# Patient Record
Sex: Male | Born: 1940 | Race: Black or African American | Hispanic: No | Marital: Single
Health system: Southern US, Community
[De-identification: ages and names within clinical notes are randomized; demographics above are authoritative.]

---

## 2004-12-17 ENCOUNTER — Emergency Department: Payer: Self-pay | Admitting: Internal Medicine

## 2005-05-19 ENCOUNTER — Emergency Department: Payer: Self-pay | Admitting: Emergency Medicine

## 2006-10-05 ENCOUNTER — Emergency Department: Payer: Self-pay | Admitting: Emergency Medicine

## 2006-10-05 ENCOUNTER — Other Ambulatory Visit: Payer: Self-pay

## 2006-12-03 ENCOUNTER — Ambulatory Visit: Payer: Self-pay | Admitting: Urology

## 2006-12-08 ENCOUNTER — Ambulatory Visit: Payer: Self-pay | Admitting: Urology

## 2006-12-09 ENCOUNTER — Ambulatory Visit: Payer: Self-pay | Admitting: Radiation Oncology

## 2006-12-24 ENCOUNTER — Ambulatory Visit: Payer: Self-pay | Admitting: Urology

## 2007-01-09 ENCOUNTER — Ambulatory Visit: Payer: Self-pay | Admitting: Radiation Oncology

## 2007-01-11 ENCOUNTER — Ambulatory Visit: Payer: Self-pay | Admitting: Radiation Oncology

## 2007-02-08 ENCOUNTER — Ambulatory Visit: Payer: Self-pay | Admitting: Radiation Oncology

## 2007-03-11 ENCOUNTER — Ambulatory Visit: Payer: Self-pay | Admitting: Radiation Oncology

## 2007-04-11 ENCOUNTER — Ambulatory Visit: Payer: Self-pay | Admitting: Radiation Oncology

## 2007-05-09 ENCOUNTER — Ambulatory Visit: Payer: Self-pay | Admitting: Radiation Oncology

## 2007-06-09 ENCOUNTER — Ambulatory Visit: Payer: Self-pay | Admitting: Radiation Oncology

## 2007-08-09 ENCOUNTER — Ambulatory Visit: Payer: Self-pay | Admitting: Radiation Oncology
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
## 2010-06-21 ENCOUNTER — Ambulatory Visit: Payer: Self-pay | Admitting: Ophthalmology

## 2010-06-26 ENCOUNTER — Ambulatory Visit: Payer: Self-pay | Admitting: Ophthalmology

## 2011-04-02 ENCOUNTER — Inpatient Hospital Stay: Payer: Self-pay | Admitting: Internal Medicine

## 2011-04-02 LAB — COMPREHENSIVE METABOLIC PANEL
Albumin: 3.3 g/dL — ABNORMAL LOW (ref 3.4–5.0)
Alkaline Phosphatase: 74 U/L (ref 50–136)
Anion Gap: 8 (ref 7–16)
BUN: 13 mg/dL (ref 7–18)
Calcium, Total: 8.9 mg/dL (ref 8.5–10.1)
Co2: 28 mmol/L (ref 21–32)
Creatinine: 0.89 mg/dL (ref 0.60–1.30)
Glucose: 98 mg/dL (ref 65–99)
Osmolality: 279 (ref 275–301)
SGPT (ALT): 11 U/L — ABNORMAL LOW
Sodium: 140 mmol/L (ref 136–145)
Total Protein: 7.3 g/dL (ref 6.4–8.2)

## 2011-04-02 LAB — URINALYSIS, COMPLETE
Bilirubin,UR: NEGATIVE
Blood: NEGATIVE
Glucose,UR: NEGATIVE mg/dL (ref 0–75)
Nitrite: NEGATIVE
Ph: 7 (ref 4.5–8.0)
Protein: NEGATIVE
RBC,UR: 2 /HPF (ref 0–5)
Squamous Epithelial: 1
WBC UR: 42 /HPF (ref 0–5)

## 2011-04-02 LAB — CBC
HCT: 43.8 % (ref 40.0–52.0)
HGB: 14.4 g/dL (ref 13.0–18.0)
MCH: 29 pg (ref 26.0–34.0)
MCHC: 32.8 g/dL (ref 32.0–36.0)
MCV: 88 fL (ref 80–100)
RBC: 4.95 10*6/uL (ref 4.40–5.90)
RDW: 14.8 % — ABNORMAL HIGH (ref 11.5–14.5)

## 2011-04-02 LAB — TROPONIN I: Troponin-I: <0.02 ng/mL

## 2011-04-03 LAB — COMPREHENSIVE METABOLIC PANEL
Albumin: 3.1 g/dL — ABNORMAL LOW (ref 3.4–5.0)
Alkaline Phosphatase: 63 U/L (ref 50–136)
Bilirubin,Total: 0.3 mg/dL (ref 0.2–1.0)
Co2: 24 mmol/L (ref 21–32)
Creatinine: 0.72 mg/dL (ref 0.60–1.30)
EGFR (Non-African Amer.): >60
Glucose: 99 mg/dL (ref 65–99)
Osmolality: 282 (ref 275–301)
SGOT(AST): 15 U/L (ref 15–37)
SGPT (ALT): 10 U/L — ABNORMAL LOW
Sodium: 141 mmol/L (ref 136–145)
Total Protein: 7 g/dL (ref 6.4–8.2)

## 2011-04-03 LAB — CBC WITH DIFFERENTIAL/PLATELET
Basophil %: 1 %
Eosinophil #: 0 10*3/uL (ref 0.0–0.7)
Eosinophil %: 0.7 %
HGB: 14.6 g/dL (ref 13.0–18.0)
MCH: 28.8 pg (ref 26.0–34.0)
MCV: 88 fL (ref 80–100)
Monocyte #: 0.4 10*3/uL (ref 0.0–0.7)
Neutrophil #: 3.2 10*3/uL (ref 1.4–6.5)
RBC: 5.09 10*6/uL (ref 4.40–5.90)
WBC: 5.1 10*3/uL (ref 3.8–10.6)

## 2011-04-03 LAB — LIPID PANEL
HDL Cholesterol: 54 mg/dL (ref 40–60)
Ldl Cholesterol, Calc: 102 mg/dL — ABNORMAL HIGH (ref 0–100)
Triglycerides: 79 mg/dL (ref 0–200)
VLDL Cholesterol, Calc: 16 mg/dL (ref 5–40)

## 2013-11-08 ENCOUNTER — Ambulatory Visit: Payer: Self-pay | Admitting: Internal Medicine

## 2013-12-04 LAB — URINALYSIS, COMPLETE
Bilirubin,UR: NEGATIVE
GLUCOSE, UR: NEGATIVE mg/dL (ref 0–75)
KETONE: NEGATIVE
Nitrite: NEGATIVE
PH: 5 (ref 4.5–8.0)
Protein: 100
SPECIFIC GRAVITY: 1.018 (ref 1.003–1.030)
Squamous Epithelial: 2

## 2013-12-04 LAB — CBC
HCT: 33.5 % — AB (ref 40.0–52.0)
HGB: 10.4 g/dL — ABNORMAL LOW (ref 13.0–18.0)
MCH: 26 pg (ref 26.0–34.0)
MCHC: 31.2 g/dL — ABNORMAL LOW (ref 32.0–36.0)
MCV: 84 fL (ref 80–100)
PLATELETS: 485 10*3/uL — AB (ref 150–440)
RBC: 4.02 10*6/uL — ABNORMAL LOW (ref 4.40–5.90)
RDW: 15.3 % — ABNORMAL HIGH (ref 11.5–14.5)
WBC: 10.6 10*3/uL (ref 3.8–10.6)

## 2013-12-04 LAB — COMPREHENSIVE METABOLIC PANEL
ANION GAP: 4 — AB (ref 7–16)
Albumin: 2.1 g/dL — ABNORMAL LOW (ref 3.4–5.0)
Alkaline Phosphatase: 106 U/L
BUN: 16 mg/dL (ref 7–18)
Bilirubin,Total: 0.2 mg/dL (ref 0.2–1.0)
CALCIUM: 7.7 mg/dL — AB (ref 8.5–10.1)
CO2: 30 mmol/L (ref 21–32)
CREATININE: 0.88 mg/dL (ref 0.60–1.30)
Chloride: 107 mmol/L (ref 98–107)
EGFR (African American): >60
GLUCOSE: 92 mg/dL (ref 65–99)
OSMOLALITY: 282 (ref 275–301)
Potassium: 4 mmol/L (ref 3.5–5.1)
SGOT(AST): 13 U/L — ABNORMAL LOW (ref 15–37)
SGPT (ALT): 10 U/L — ABNORMAL LOW
SODIUM: 141 mmol/L (ref 136–145)
Total Protein: 6.8 g/dL (ref 6.4–8.2)

## 2013-12-04 LAB — TROPONIN I

## 2013-12-04 LAB — LIPASE, BLOOD: Lipase: 44 U/L — ABNORMAL LOW (ref 73–393)

## 2013-12-05 ENCOUNTER — Inpatient Hospital Stay: Payer: Self-pay | Admitting: Internal Medicine

## 2013-12-05 LAB — CLOSTRIDIUM DIFFICILE(ARMC)

## 2013-12-06 LAB — URINALYSIS, COMPLETE
BACTERIA: NONE SEEN
Bilirubin,UR: NEGATIVE
GLUCOSE, UR: NEGATIVE mg/dL (ref 0–75)
KETONE: NEGATIVE
NITRITE: NEGATIVE
Ph: 6 (ref 4.5–8.0)
RBC,UR: 2873 /HPF (ref 0–5)
SQUAMOUS EPITHELIAL: NONE SEEN
Specific Gravity: 1.009 (ref 1.003–1.030)

## 2013-12-06 LAB — PROTIME-INR
INR: 1.1
Prothrombin Time: 13.6 secs (ref 11.5–14.7)

## 2013-12-06 LAB — APTT: Activated PTT: 31.5 secs (ref 23.6–35.9)

## 2013-12-07 LAB — CBC WITH DIFFERENTIAL/PLATELET
BASOS ABS: 0.1 10*3/uL (ref 0.0–0.1)
Basophil %: 1 %
Eosinophil #: 0.7 10*3/uL (ref 0.0–0.7)
Eosinophil %: 5.9 %
HCT: 30 % — ABNORMAL LOW (ref 40.0–52.0)
HGB: 9.5 g/dL — AB (ref 13.0–18.0)
LYMPHS ABS: 1.6 10*3/uL (ref 1.0–3.6)
Lymphocyte %: 13.3 %
MCH: 25.8 pg — ABNORMAL LOW (ref 26.0–34.0)
MCHC: 31.5 g/dL — AB (ref 32.0–36.0)
MCV: 82 fL (ref 80–100)
MONO ABS: 0.8 x10 3/mm (ref 0.2–1.0)
MONOS PCT: 6.7 %
NEUTROS PCT: 73.1 %
Neutrophil #: 9 10*3/uL — ABNORMAL HIGH (ref 1.4–6.5)
Platelet: 412 10*3/uL (ref 150–440)
RBC: 3.66 10*6/uL — AB (ref 4.40–5.90)
RDW: 15.2 % — ABNORMAL HIGH (ref 11.5–14.5)
WBC: 12.3 10*3/uL — ABNORMAL HIGH (ref 3.8–10.6)

## 2013-12-08 ENCOUNTER — Ambulatory Visit: Payer: Self-pay | Admitting: Urology

## 2013-12-08 ENCOUNTER — Ambulatory Visit: Payer: Self-pay | Admitting: Internal Medicine

## 2013-12-09 LAB — CBC WITH DIFFERENTIAL/PLATELET
BASOS PCT: 0.8 %
Basophil #: 0.1 10*3/uL (ref 0.0–0.1)
EOS PCT: 10.7 %
Eosinophil #: 1.4 10*3/uL — ABNORMAL HIGH (ref 0.0–0.7)
HCT: 26.2 % — ABNORMAL LOW (ref 40.0–52.0)
HGB: 7.9 g/dL — ABNORMAL LOW (ref 13.0–18.0)
LYMPHS ABS: 1.9 10*3/uL (ref 1.0–3.6)
Lymphocyte %: 14.9 %
MCH: 25 pg — ABNORMAL LOW (ref 26.0–34.0)
MCHC: 30.2 g/dL — ABNORMAL LOW (ref 32.0–36.0)
MCV: 83 fL (ref 80–100)
MONOS PCT: 6.5 %
Monocyte #: 0.8 x10 3/mm (ref 0.2–1.0)
NEUTROS ABS: 8.7 10*3/uL — AB (ref 1.4–6.5)
NEUTROS PCT: 67.1 %
PLATELETS: 373 10*3/uL (ref 150–440)
RBC: 3.17 10*6/uL — ABNORMAL LOW (ref 4.40–5.90)
RDW: 15.1 % — AB (ref 11.5–14.5)
WBC: 12.9 10*3/uL — AB (ref 3.8–10.6)

## 2013-12-09 LAB — BASIC METABOLIC PANEL
ANION GAP: 2 — AB (ref 7–16)
BUN: 9 mg/dL (ref 7–18)
CREATININE: 0.85 mg/dL (ref 0.60–1.30)
Calcium, Total: 7.2 mg/dL — ABNORMAL LOW (ref 8.5–10.1)
Chloride: 109 mmol/L — ABNORMAL HIGH (ref 98–107)
Co2: 27 mmol/L (ref 21–32)
EGFR (African American): >60
Glucose: 97 mg/dL (ref 65–99)
Osmolality: 274 (ref 275–301)
Potassium: 3.9 mmol/L (ref 3.5–5.1)
SODIUM: 138 mmol/L (ref 136–145)

## 2013-12-10 LAB — CBC WITH DIFFERENTIAL/PLATELET
BASOS ABS: 0.2 10*3/uL — AB (ref 0.0–0.1)
BASOS PCT: 1.3 %
Eosinophil #: 1.5 10*3/uL — ABNORMAL HIGH (ref 0.0–0.7)
Eosinophil %: 12.5 %
HCT: 27.4 % — ABNORMAL LOW (ref 40.0–52.0)
HGB: 8.3 g/dL — AB (ref 13.0–18.0)
Lymphocyte #: 1.4 10*3/uL (ref 1.0–3.6)
Lymphocyte %: 11.3 %
MCH: 24.9 pg — AB (ref 26.0–34.0)
MCHC: 30.5 g/dL — ABNORMAL LOW (ref 32.0–36.0)
MCV: 82 fL (ref 80–100)
Monocyte #: 0.8 x10 3/mm (ref 0.2–1.0)
Monocyte %: 6.3 %
Neutrophil #: 8.5 10*3/uL — ABNORMAL HIGH (ref 1.4–6.5)
Neutrophil %: 68.6 %
Platelet: 409 10*3/uL (ref 150–440)
RBC: 3.35 10*6/uL — AB (ref 4.40–5.90)
RDW: 15.2 % — AB (ref 11.5–14.5)
WBC: 12.4 10*3/uL — ABNORMAL HIGH (ref 3.8–10.6)

## 2013-12-10 LAB — CULTURE, BLOOD (SINGLE)

## 2013-12-10 LAB — BASIC METABOLIC PANEL
Anion Gap: 4 — ABNORMAL LOW (ref 7–16)
BUN: 10 mg/dL (ref 7–18)
CALCIUM: 7.1 mg/dL — AB (ref 8.5–10.1)
CO2: 26 mmol/L (ref 21–32)
CREATININE: 0.78 mg/dL (ref 0.60–1.30)
Chloride: 107 mmol/L (ref 98–107)
Glucose: 95 mg/dL (ref 65–99)
Osmolality: 273 (ref 275–301)
POTASSIUM: 4.2 mmol/L (ref 3.5–5.1)
Sodium: 137 mmol/L (ref 136–145)

## 2013-12-12 LAB — PSA: PSA: 2.3 ng/mL (ref 0.0–4.0)

## 2013-12-12 LAB — HEMOGLOBIN: HGB: 9.1 g/dL — ABNORMAL LOW (ref 13.0–18.0)

## 2013-12-15 ENCOUNTER — Ambulatory Visit: Payer: Self-pay | Admitting: Oncology

## 2013-12-16 LAB — PATHOLOGY REPORT

## 2014-01-08 ENCOUNTER — Ambulatory Visit: Payer: Self-pay | Admitting: Oncology

## 2014-01-08 ENCOUNTER — Ambulatory Visit: Payer: Self-pay | Admitting: Internal Medicine

## 2014-03-10 DEATH — deceased

## 2014-07-01 NOTE — Consult Note (Signed)
Note Type Consult   HPI: This 74 year old Male patient presents to the clinic for initial evaluation of  lung mass.  Subjective: Chief Complaint/Diagnosis:   Lung mass, highly suspicious for underlying malignancy. HPI:   Patient is a 74 year old male who was recently admitted to the hospital with persistent diarrhea for approximately one week. He also complains of increased weakness and fatigue. He also reported a nearly 40 pound weight loss over the past year. He states he has a good appetite. He has no neurologic complaints. He denies any fevers. He denies any chest pain, shortness of breath, hemoptysis, or cough. He denies any nausea or vomiting. Patient otherwise feels well and offers no further specific complaints.   Review of Systems:  Performance Status (ECOG): 1  Pain ?: No complaints (0, none)  Emotional well-being: None  Review of Systems:   As per HPI. Otherwise, 10 point system review was negative.   Allergies:  ASA: Other  PFSH: Additional Past Medical and Surgical History: peptic ulcer disease, SPO, hypertension, partial gastrectomy, partial small bowel resection.    Family history: "Cancer" in brothers and sisters.    Social history: Positive tobacco, unclear how much. Patient denies alcohol.   Vital Signs:  :: vital signs stable, patient afebrile.   Physical Exam:  General: cachectic-looking, no acute distress  Mental Status: normal affect  Eyes: anicteric sclera  Head, Ears, Nose,Throat: Normocephalic, moist mucous membranes, clear oropharynx without erythema or thrush.  Neck, Thyroid: No palpable lymphadenopathy, thyroid midline without nodules.  Respiratory: clear to auscultation bilaterally  Cardiovascular: regular rate and rhythm, no murmur, rub, or gallop  Gastrointestinal: soft, nondistended, nontender, no organomegaly.  normal active bowel sounds  Musculoskeletal: No edema  Skin: No rash or petechiae noted  Neurological: alert, answering all  questions appropriately.  Cranial nerves grossly intact   Lab Results Review:  Lab Results   Comprehensive Metabolic Panel  :  [id number] 22:50:  Last Resulted Date/Time.141 K+: 4.0 CO2: 30 Chl: 107 BUN: 16 Cre: 0.88 Glu:92 Calcium: 7.7(L) Alk Phos: 106 ALT: 10(L) AST: 13(L) T.Prot: 6.8 Alb: 2.1(L) eGFR-AA: >60 eGFR: >60 .   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  Diffuse emphysematous changes in the lungs. No other lung nodules  identified. No focal acute process demonstrated in the abdomen or  pelvis. Focal central dilated bowel structure may represent a large  diverticulum. Anterior compression fracture of T12 vertebra.  Multiple old rib fractures.      Electronically Signed    By: Lucienne Capers M.D.    On: 12/05/2013 02:19         Verified By: Neale Burly, M.D.,  Assessment and Plan: Impression:   Lung mass highly suspicious for underlying malignancy. Plan:   1. Lung mass: Highly suspicious for underlying malignancy and patient will require a biopsy to confirm. Pulmonary has been consult. Have ordered a PET scan for Tuesday morning for further evaluation and staging purposes. If patient is discharged, much of his workup can be completed as an outpatient.Diarrhea: Unlikely related to his underlying malignancy. consult, will follow.  Electronic Signatures: Delight Hoh (MD)  (Signed 28-Sep-15 17:44)  Authored: Note Type, History of Present Illness, CC/HPI, Review of Systems, ALLERGIES, Patient Family Social History, Vital Signs, Physical Exam, Lab Results Review, Rad Results  Review, Assessment and Plan   Last Updated: 28-Sep-15 17:44 by Delight Hoh (MD)

## 2014-07-01 NOTE — Consult Note (Signed)
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 Further testing is currently pending. PET scan results also noted. Will await final pathology prior to commenting further. By report, [place]. If patient remains in the hospital over the weekend, will order MRI of the brain for Monday to complete the staging workup.  follow.    Electronic Signatures: Gerarda FractionFinnegan, Timothy (MD)  (Signed on 02-Oct-15 11:51)  Authored  Last Updated: 02-Oct-15 11:51 by Gerarda FractionFinnegan, Timothy (MD)

## 2014-07-01 NOTE — Discharge Summary (Signed)
PATIENT NAME:  Kent Dennis, Kent Dennis DATE OF BIRTH:  1940-12-17  DATE OF ADMISSION:  12/05/2013 DATE OF DISCHARGE:  12/12/2013  PRIMARY CARE PHYSICIAN: Nonlocal.  ONCOLOGIST: Tollie Pizzaimothy J. Orlie DakinFinnegan, MD   FINAL DIAGNOSES:  1.  Large lung mass with hilar metastases; pathology still pending.  2.  Hematuria, benign prostatic hypertrophy, urinary retention, possible urinary tract infection, history of prostate cancer.  3.  Malnutrition.  4.  Hypertension.  5.  Anemia.  6.  Constipation.   MEDICATIONS ON DISCHARGE INCLUDE: Oxycodone 5 mg every 6 hours as needed for severe pain, Flomax 0.4 mg daily, MiraLax 17 g once a day, Ensure Plus 237 mL 3 times a day, Cipro 500 mg every 12 hours for 5 days then stop, ferrous sulfate 325 mg 1 tablet twice a day.   DISPOSITION: The patient was admitted on 12/05/2013 and was discharged to rehab on 12/12/2013.   LABORATORY AND RADIOLOGICAL DATA DURING THE HOSPITAL COURSE INCLUDED: A chest x-ray that showed a left posterior basilar opacity concerning for pneumonia associated pleural effusion, and a large rounded mass noted in the posterior portion of the left upper lobe; CT scan was recommended.   Abdominal x-ray negative. Urinalysis with 3+ blood, nitrites negative, leukocyte esterase trace. Troponin negative. Glucose 92, BUN 16, creatinine 0.88, sodium 141, potassium 4.0, chloride 107, CO2 of 30, calcium 7.7. Liver function tests in normal range. Albumin 2.1. White blood cell count 10.6, hemoglobin and hematocrit 10.4 and 33.5, platelet count 485,000. EKG showed normal sinus rhythm and left axis deviation. Blood cultures were negative.   CT scan of the chest, abdomen and pelvis with contrast showed a large mass in the left apex with enlarged left hilar lymph nodes, likely representing primary lung cancer with hilar metastases. Diffuse emphysematous changes. No other nodules identified. Anterior compression fracture at T12 vertebral body. Multiple old rib  fractures.   Stool for Clostridium difficile was negative.   PET CT scan showed hypermetabolic left apical Pancoast tumor, most consistent with primary bronchogenic carcinoma. No evidence of hypermetabolic nodal metastases. Hypermetabolism in the gluteal cleft. Bladder distention suggesting component of bladder outlet obstruction. Advanced atherosclerosis.   Repeat urinalysis with 3+ leukocyte esterase, 3+ blood. Biopsy done on 11/22/2013. PSA 2.3.   Hemoglobin dipped down to 7.9 on 12/09/2013. Hemoglobin 9.1 on 12/12/2013.   MRI of the brain, with and without contrast, showed no evidence of intracranial metastatic disease. No definite region of osseous metastases. Paranasal sinus disease. Generalized atrophy, Remote areas of cerebral infarction. Extensive chronic microvascular ischemic change.  CONSULTANTS DURING THE HOSPITAL COURSE INCLUDED: Dr. Tollie Pizzaimothy J. Orlie DakinFinnegan, oncology. Dr. Harriett SineNancy Phifer, palliative care. Dr. Angelina Pihajesh R. [name], urology.   HOSPITAL COURSE PER PROBLEM LIST:  1.  For the patient's large lung mass with hilar metastases, Dr. Orlie DakinFinnegan will follow up closely as an outpatient. The patient had a biopsy while here; we are still waiting for the final pathology results. Dr. Orlie DakinFinnegan will follow up the patient closely from the rehab and set up whatever needs to be done. The patient is a Do Not Resuscitate; overall prognosis is poor.  2.  Hematuria, benign prostatic hypertrophy,  urinary retention,  possible urinary tract infection, history of prostate cancer: The patient has a Foley in. Urology recommended keeping the Foley in for 1 week and then trying to take it out. The patient can follow up with Dr. Vanna ScotlandAshley Brandon as an outpatient for a cystoscopy for further workup if clinically warranted. Will finish up a course of  Cipro for possible UTI. Unfortunately, no urine culture was sent during the hospital course, Flomax given for BPH. PSA is normal range.  3.  Malnutrition. The patient is  on Ensure.  4.  Hypertension. I will hold off on blood pressure medication since the patient is likely to under treatment.  5.  Anemia, likely of chronic disease. Will start iron supplementation. Can consider Procrit as an outpatient.  6.  Constipation. Will continue MiraLax on a daily basis.   TIME SPENT ON DISCHARGE: 40 minutes.   OVERALL PROGNOSIS: Poor. I did explain to the patient's son, Trachea is midline, that it would probably be best that the patient goes out to rehab and get his treatments here rather than going to [name]. The patient does not want to go to [name]. We will send out to rehab.   Case discussed with Dr. Orlie Dakin prior to discharge.    ____________________________ Herschell Dimes. Renae Gloss, MD rjw:MT D: 12/12/2013 08:56:27 ET T: 12/12/2013 09:08:13 ET JOB#: [id number]  cc: Herschell Dimes. Renae Gloss, MD, <Dictator> Tollie Pizza. Orlie Dakin, MD Salley Scarlet MD ELECTRONICALLY SIGNED 12/25/2013 12:47

## 2014-07-01 NOTE — H&P (Signed)
PATIENT NAME:  Kent Dennis, Kent Dennis MR#:  [id number] DATE OF BIRTH:  [birth year]  DATE OF ADMISSION:  12/05/2013   REFERRING PHYSICIAN: Rebecka Apley, MD  PRIMARY CARE DOCTOR: Non-local   ADMISSION DIAGNOSIS:  Lung mass.  HISTORY OF PRESENT ILLNESS: This 74 year old African American male who presents to the Emergency Department complaining of diarrhea. The patient states that he has been having loose stools for approximately 1 week. He denies fevers, nausea or vomiting or abdominal pain. He admits that he has had a great appetite, but that he feels generally weak. He is also concerned that his niece, with whom he stays, cannot take care of him. Following routine evaluation of his complaints, a suspicious mass was seen on chest x-ray and characterized better as a solid mass on CT scan. The patient denies any pain. This prompted the Emergency Department to call for admission.   REVIEW OF SYSTEMS:  CONSTITUTIONAL: The patient denies fever, but admits to weakness.  EYES: Denies diplopia or inflammation.  EARS, NOSE AND THROAT: Denies tinnitus or difficulty swallowing.  RESPIRATORY: Admits to an occasional cough, but denies shortness of breath.  CARDIOVASCULAR: Denies chest pain or palpitations.  GASTROINTESTINAL: Denies nausea or vomiting, but admits diarrhea.  GENITOURINARY: The patient denies dysuria, but admits to increased frequency. He denies hesitancy.  ENDOCRINE: The patient denies polydipsia or polyuria.  HEMATOLOGIC AND LYMPHATIC: The patient denies easy bruising or bleeding.  MUSCULOSKELETAL: The patient denies myalgias or arthralgias, although he admits that his whole left side aches sometimes since he has had a stroke.  SKIN: The patient denies rashes or lesions.  NEUROLOGIC: Denies numbness in his extremities or dysarthria.  PSYCHIATRIC: Denies depression or suicidal ideation.   PAST MEDICAL HISTORY: Peptic ulcer disease, small bowel obstruction, hypertension.   PAST SURGICAL  HISTORY: Partial gastrectomy and small bowel resection.   FAMILY HISTORY: Cancers in his brothers and sisters who are deceased of those conditions. He is not clear on what types of cancers they had.   SOCIAL HISTORY: The patient does not drink or do any drugs. He says he will occasionally smoke a cigarette.   PERTINENT LABORATORY RESULTS AND RADIOGRAPHIC FINDINGS: BMP is normal. Calcium 7.7, albumin  2.1, alkaline phosphatase 106. AST 13, ALT 10. Troponin is negative. White blood cell count is 10.6, hemoglobin is 10.4, hematocrit is 33.5. Urinalysis shows 1668 white blood cells per high-power field, nitrite negative, 100 mg/dL of protein; there is  2+ bacteria seen in the urine.   A chest x-ray shows a left posterior basilar opacity concerning for pneumonia or atelectasis with associated pleural effusion. There is also a large round mass noted in the posterior portion of the left upper lobe.   A 3-view abdominal x-ray shows new development of a large posterior lobular mass in the left apex. There are some thickening of the pleura in the left costophrenic angle or fluid with linear fibrosis or atelectasis in the left base. There is a nonobstructive bowel gas pattern.   PHYSICAL EXAMINATION:  VITAL SIGNS: Temperature is 98.4, pulse 81, respirations 18, blood pressure 135/93, pulse oximetry is 98% on room air.  GENERAL: The patient is alert and oriented x 3, in no apparent distress.  HEENT: Normocephalic, atraumatic. Pupils equal, round, and reactive to light and accommodation. Extraocular movements are intact. Mucous membranes are moist.  NECK: Trachea is midline. No adenopathy.  CHEST: Symmetric and atraumatic.  CARDIOVASCULAR: Regular rate and rhythm. Normal S1, S2. No rubs, clicks, or murmurs appreciated.  LUNGS: Clear  to auscultation. Normal effort and excursion.  ABDOMEN: Positive bowel sounds. Soft, nontender, nondistended. No hepatosplenomegaly.  GENITOURINARY: Deferred.  MUSCULOSKELETAL:  The patient moves all 4 extremities equally. The left leg is subjectively weaker than the right.  SKIN: No rashes or lesions.  EXTREMITIES: No clubbing, cyanosis, or edema.  NEUROLOGIC: Cranial nerves II through XII are grossly intact.  PSYCHIATRIC: Mood is normal. Affect is congruent.   ASSESSMENT AND PLAN: This is a 74 year old male with a new lung mass as well as cachexia, urinary tract infection and possible pneumonia.  1.  Lung mass. The patient's original complaint was nothing to do with his respiratory status, but the mass was discovered incidentally. His sodium is normal and his corrected calcium is normal. I have ordered a hematology and oncology consult to establish a game plan for diagnosis and treatment, which will likely take place as an outpatient.  2.  Malnutrition. The patient is cachectic. A dietary consult has been ordered. His appetite is good. He is likely catabolic due to his malignancy.  3.  Urinary tract infection. The patient denies dysuria, but he definitely has pyuria. He has been on ciprofloxacin as an outpatient. We have placed him on levofloxacin here in the hospital to treat pneumonia, which will also cover potentially urinary tract infection.  4.  Pneumonia, community-acquired. It is suspected from imaging, but his only clinical symptoms is an occasional deep nonproductive cough. Levofloxacin will cover community-acquired pneumonia and we will continue this medication.  5.  Diarrhea. We will test the patient for Clostridium difficile toxin due to his chronic antibiotic use. He has been placed on contact precautions.  6.  Hypertension. Continue lisinopril.  7.  Deep vein thrombosis prophylaxis with Lovenox.  8.  Gastrointestinal prophylaxis. Pantoprazole for peptic ulcer disease.   CODE STATUS: The patient is a Full Code.   TIME SPENT ON ADMISSION ORDERS AND PATIENT CARE: Approximately 35 minutes.    ____________________________ Kelton PillarMichael S. Sheryle Hailiamond,  MD msd:MT D: 12/05/2013 06:34:41 ET T: 12/05/2013 06:50:34 ET JOB#: [id number]  cc: Kelton PillarMichael S. Sheryle Hailiamond, MD, <Dictator> Kelton PillarMICHAEL S Dodger Sinning MD ELECTRONICALLY SIGNED 12/06/2013 2:36

## 2014-07-01 NOTE — Consult Note (Signed)
   Comments   With pt's permission, I spoke with his niece, Alvis LemmingsDawn, with whom pt lives. Updated her on pt's condition including the probable lung cancer. Niece says that she knew something was wrong with pt but couldn't get him to see a MD. She says that pt has become much less active and has had significant wt loss.  told niece that pt said he would like her to be his HCPOA. Niece agrees with this. She plans to be at hospital tomorrow and will have chaplain see pt to complete documentation.  will meet with niece and pt tomorrow to further discuss goals of therapy including code status.   Electronic Signatures: Michaeline Eckersley, Harriett SineNancy (MD)  (Signed 28-Sep-15 17:30)  Authored: Palliative Care   Last Updated: 28-Sep-15 17:30 by Ameilia Rattan, Harriett SineNancy (MD)

## 2014-07-01 NOTE — Consult Note (Signed)
REASON FOR CONSULTATION: URINARY RETENTION AND HEMATURIA PHYSICIAN: DR. SHAH OF PRESENT ILLNESS: Kent Dennis is a 74 YO AA gentleman with MMP admitted for malaise and workup of a lung mass seen on CT. He underwent CT guided biopsy [facility] Urology was consulted Kent Rohrerwhen hematuria was noted and patient was noted to be in urinary retention. He appears emaciated on my interview. He endorses to me that he has been voiding at home but at times he doesn't feel that he empties entirely. He is at times incontinent and has significant nocturia. He has never noted gross hematuria. He endorses to me a history of prostate cancer that was managed with radiation therapy ~5 years ago. He has not had significant followup. He had a foley catheter placed with return of blood tinged urine (> 650mL). MEDICAL HISTORY: cancer s/p IMRT ** per records, it seems he finished IMRT in 2009.ulcer diseasebowel obstructionmass concerning for lung CAbowel obstructionCVA with left sided deficits SURGICAL HISTORY: gastrectomyresection Patient tells me that he was not taking any medications at home ASA (history of bleeding from ulcers) HISTORY: Endorses 1ppd x 20-25 year smoking history in the past.  HISTORY: no known GU malignancies. OF SYSTEMS: significant for abnormal bowel habits, LUTS, and a 30-40 lb weight loss over the last year EXAM:emaciated, lying in bedmood & affect, minimally conversantrespirationsND, NT, no guarding/reboundCVA tendernessDRE but patient unable to tolerate. He has a tender hemorrhoid vs rectal prolapse prohibiting DRE. catheter draining infectious appearing, malodorous urinec/c/e, wwp SCr 0.83, UA demonstrates nitrite negative, LE positive urine, WBC 12.3 CT personally reviewed. Large left hilar lung mass with ongoing workup. Kidneys normal bilaterally. Bladder markedly distended with large bladder diverticulum noted. Prostate is enlarged with irregular borders with a significantly enlarged median  lobe component.  74 YO AA gentleman with a left lung mass concerning for lung CA, history of prostate CA with current inhouse issues of urinary retention and hematuria. Preliminary urine assessment and WBC concerning for UTI. Culture pending.  Kent RohrerHolman has a number of urologic issues. I had a long coversation with him regarding all of these issues.  History of prostate cancer - per records, completed IMRT in 2009. I do not have pathology results of his PCa. He has been lost to f/u since. Urinary retention - likely from a hx of PCa and BPH. 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 Hematuria - concerning given smoking hx and hx of IMRT therapy for PCa.  PSA - will preface utility of PSA in setting of acute urinary retention may have limited utility. However, if significantly elevated, it will suggest presence of metastatic disease. Continue foley catheter x 7 days. Patient will [facility] - Dr. Morrie SheldonAshley BrandonFlomax 0.4mg  daily Patient will need formal hematuria evaluation to assess for possible bladder cancer. CT does not demosntrate any renal masses. However, cystoscopy and cytology should be performed given patient's history of smoking and history of IMRT to treat his PCa in the past. Patient can have this performed as outpatient with Dr. [facility] ScotlandAshley [facility] you for this interesting consult.  Kent BookbinderKurpad, MDSurgery  Electronic Signatures: Kent PihKurpad, Kent Dennis (MD)  (Signed on 01-Oct-15 18:59)  Authored  Last Updated: 01-Oct-15 18:59 by Kent PihKurpad, Kent Dennis (MD)

## 2014-07-02 NOTE — H&P (Signed)
PATIENT NAME:  Kent Dennis, Kent E MR#:  [id number] DATE OF BIRTH:  07-11-1940  DATE OF ADMISSION:  04/02/2011  PRIMARY DOCTOR: None.   ER REFERRING PHYSICIAN: Rockne MenghiniAnne-Caroline Norman, MD    CHIEF COMPLAINT: Weakness on the left side upper extremity and lower extremity.   HISTORY OF PRESENT ILLNESS: The patient is a 74 year old male who does not see a primary doctor and has been brought in because of weakness on the left side arm and leg since yesterday and because of persistent weakness on the left side. He came in, and he was found to have blood pressure elevation at 186/107, and I was asked to admit the patient for a possible CVA. The patient was seen at the bedside. He also has weakness on the left side hand and leg, started yesterday; and he also feels numb in the hand and leg. He was unable to walk because of weakness. He denies any slurred speech. No headache. No dizziness. No weakness on the right side, and he did not have any chest pain. No fever and no loss of consciousness. He did not have any seizures. The patient usually lives with a brother.   PAST MEDICAL HISTORY: Significant for:  1. History of stomach ulcers with bleeding and had surgery for that in 80s.  2. History of prostate cancer, status post radiation.  3. History of hypertension but does not take any medication.   ALLERGIES: The patient is allergic to aspirin, gives stomach ulcers.   SOCIAL HISTORY: Smokes 1 pack per day and lives with the patient's brother-in-law. Occasional alcohol. No drugs.   FAMILY HISTORY: No history of hypertension or diabetes.   MEDICATIONS: None.   PAST SURGICAL HISTORY:  1. Four surgeries for stomach ulcers.  2. History of surgery for the eye on the left side with vitrectomy.  3. History of cataract surgery for the left eye.    REVIEW OF SYSTEMS: CONSTITUTIONAL: Complains of weakness in the left side hand and leg along with numbness. EYES: No blurred vision. Has history of cataracts present  before. ENT: No tinnitus. No epistaxis. No difficulty swallowing. RESPIRATORY: Has no cough, no wheezing. CARDIOVASCULAR: No chest pain. No palpitations. GASTROINTESTINAL: No nausea. No vomiting. No abdominal pain. No gastroesophageal reflux disease. GENITOURINARY: No dysuria. The patient has foul-smelling urine. ENDOCRINE: No polyuria or nocturia. INTEGUMENTARY: No skin rashes. MUSCULOSKELETAL: No joint pains. NEUROLOGIC: He has numbness and weakness in the hand and leg on the left side. No history of seizures. No history of migraines. PSYCHIATRIC: Has no insomnia or anxiety.   PHYSICAL EXAMINATION:  VITAL SIGNS: Blood pressure 188/107, pulse 66, respirations 18, temperature 97.3. Saturations are 98% on room air. The patient's pulse was 56 when he came in.   GENERAL: The patient is alert, awake, oriented, answering questions appropriately.   HEENT: Head: Atraumatic, normocephalic. Pupils are equally reacting to light. Extraocular movements are intact. ENT: No tympanic membrane congestion. The patient has no turbinate hypertrophy. Dentition is poor.   NECK: No thyroid enlargement. Has carotid bruit on the left side. No lymphadenopathy.  RESPIRATIONS: Bilaterally clear to auscultation. No wheeze. No rales. Not using accessory muscles of respiration.   CARDIOVASCULAR: S1, S2 regular. No murmurs. PMI is not displaced. Good femoral pulse is present. No extremity edema.   ABDOMEN: Soft, nontender, nondistended. Bowel sounds are present. No hernias. No hepatosplenomegaly.   MUSCULOSKELETAL: Power is 5 out of 5 muscle strength in right upper and lower extremities. Gait not tested.   SKIN: No skin rashes.  LYMPH NODES: No lymphadenopathy.  NEUROLOGICAL: Cranial nerves are intact II through XII. Deep tendon reflexes are 2+ bilaterally. Sensations are decreased on the left hand and leg. The patient had no dysarthria. Decreased power on the left side arm and leg, around  3 out of 5; the patient has power  5 out of 5 on the right side. Finger-nose test is intact.   PSYCHIATRIC: Oriented to time, place, person.   LABORATORY, DIAGNOSTIC AND RADIOLOGICAL DATA:  CAT scan showed no mass, no hydrocephalus. Frontal, ethmoid and bilateral maxillary sinusitis. Polypoid lesion present in the maxillary sinus and nasopharynx cannot be excluded.  Chest x-ray showed the lungs are hyperinflated but no evidence of pneumonia or acute cardiopulmonary abnormality.  WBC 3.5, hemoglobin 14.4, hematocrit 43.8, platelets 173.  Electrolytes: Sodium is 140, potassium is 4.5, chloride 104, bicarbonate 28, BUN is 13, creatinine 0.89, glucose 98.  Troponin less than 0.02.  EKG: Sinus brady with 52 beats per minute, T wave inversions in lead V5 and V6.   ASSESSMENT AND PLAN: The patient is a 74 year old male with: 1. Left side weakness, concern for acute stroke: Admit to Hospitalist Service on telemetry, get full stroke work-up including MRI of the brain, carotid ultrasound and echocardiogram. The patient will have neuro checks along with Physical Therapy evaluation for left-sided weakness. He is allergic to aspirin and gets a GI bleed, so we will not give aspirin. We will check fasting lipase.  2. Malignant hypertension: Blood pressure elevation of 168/100. We will keep blood pressure on the little bit high side around 150s and use ACE inhibitors for blood pressure control, avoid beta blockers because of sinus bradycardia.  3. Tobacco abuse: I counseled the patient against smoking.  4. Gastrointestinal prophylaxis: Protonix 40 mg p.o. daily.   TIME SPENT:   Total time spent on patient care and History and Physical was 60 minutes.  ____________________________ Katha Hamming, MD sk:cbb D: 04/02/2011 17:34:09 ET T: 04/02/2011 19:21:43 ET JOB#: [id number]  cc: Katha Hamming, MD, <Dictator> Katha Hamming MD ELECTRONICALLY SIGNED 04/22/2011 14:54

## 2014-07-02 NOTE — Discharge Summary (Signed)
PATIENT NAME:  Kent MarshallHOLMAN, Arlon E MR#:  [id number] DATE OF BIRTH:  [birth year]  DATE OF ADMISSION:  04/02/2011 DATE OF DISCHARGE:  04/06/2011  DISPOSITION: [facility].   DISCHARGE DIAGNOSES:  1. Acute cerebrovascular accident with left-sided weakness. 2. Malignant hypertension.  3. Urinary tract infection.  4. Gastroesophageal reflux disease.   DISCHARGE MEDICATIONS:  1. Lisinopril 10 mg p.o. daily.  2. Aspirin 81 mg p.o. daily.  3. Cipro 500 mg p.o. twice a day for two days. 4. Simvastatin 20 mg p.o. daily.  5. Pantoprazole 40 mg p.o. daily.   DIET: Mechanical soft diet with thickened liquids and aspiration precautions with meals and medications.   ACTIVITY: Physical Therapy.   CONSULTATIONS: None.   HOSPITAL COURSE: This is a 74 year old male patient with past medical history of stomach ulcers and bleeding and prostate cancer who came in because of weakness of the left arm and leg. The patient's blood pressure was elevated at 186/107. The patient was admitted for possible cerebrovascular accident and also malignant hypertension. The patient's initial CAT scan of the head did not show any acute changes. He was admitted for CVA protocol. He was allergic to aspirin so aspirin was not started on admission. On further questioning and further discussion with the patient, aspirin was started subsequently. The patient's MRI of the brain showed acute ischemia in the posterior limb of the right internal capsule and also pansinusitis with maxillary sinus changes bilaterally, and chronic ischemia. The patient's echocardiogram showed ejection fraction of more than 55% with normal systolic function. The patient has impaired LV relaxation. Ultrasound of the carotids showed atherosclerotic disease without evidence of hemodynamically significant stenosis. The patient's LDL is 102 with cholesterol 172 and triglycerides 79 and HDL 54. He actually was started on lisinopril on admission.  Beta blockers were not given because of bradycardia and heart rate 50s to 60s. His blood pressure has improved significantly. It was 193/109 and later on it improved. Today, in the morning, it is 112/75. He does have weakness in the right arm mainly. In the rest of the extremities, the patient has almost very good power. He was seen by physical therapy and also speech therapy. Speech therapy recommended dysphagia I diet, as I told before in the dictation, and also physical therapy has been following the patient. They recommended rehab and case manager has discussed with the family, and the patient is accepted to [facility]. The patient will be going there today. According to the note from rehab, he requires minimal assistance with transfers and dressing skills and has decreased grip strength in the arms. He needs to go to short-term rehab so the patient will be going there today.   The patient has history of gastroesophageal reflux disease, so we continued on Protonix. His urine showed leukocyte esterase and also cloudy urine on admission. Urine cultures were not obtained as we requested, but he is already on Cipro so we will continue Cipro to finish the course. The patient's CBC and BMP were within normal limits. EKG showed sinus brady at 52 beats per minute on admission.  TIME SPENT ON DISCHARGE PREPARATION: More than 30 minutes. ____________________________ Katha HammingSnehalatha Tabitha Tupper, MD sk:slb D: 04/06/2011 10:03:12 ET T: 04/07/2011 10:15:36 ET JOB#: [id number]  cc: Katha HammingSnehalatha Edras Wilford, MD, <Dictator> Katha HammingSNEHALATHA Conan Mcmanaway MD ELECTRONICALLY SIGNED 04/22/2011 14:53

## 2015-05-18 IMAGING — CT CT ASPIRATION
3 of 4 series · 18 of 32 positions shown, 22 images · non-contrast
Comparison: none

CLINICAL DATA: Left lung mass, hypermetabolic on PET-CT. Small
pleural effusion.

[Series 2: routine chest · axial · 0.62mm/px · z∈[+1145,+1255]mm · 7 of 30 slices shown]
[im 4/30  soft-tissue]
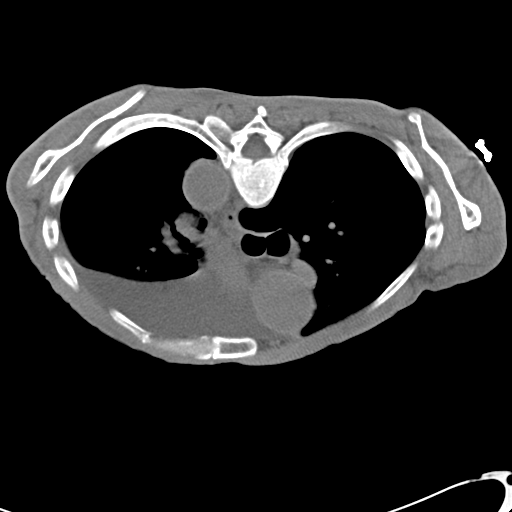
[im 8/30  soft-tissue]
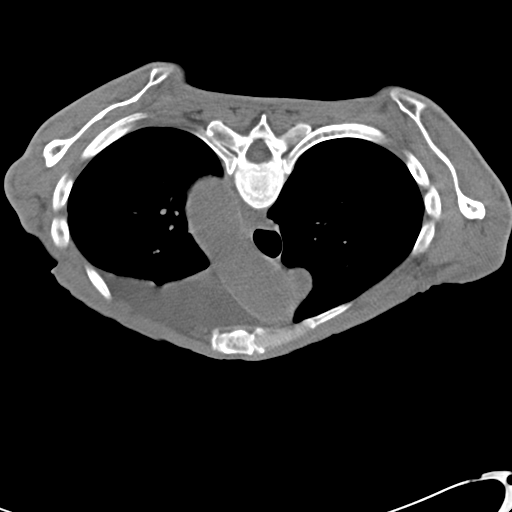
[im 11/30  soft-tissue]
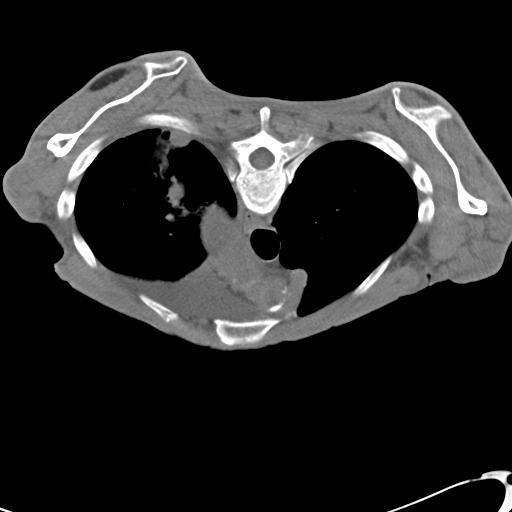
[im 15/30  soft-tissue]
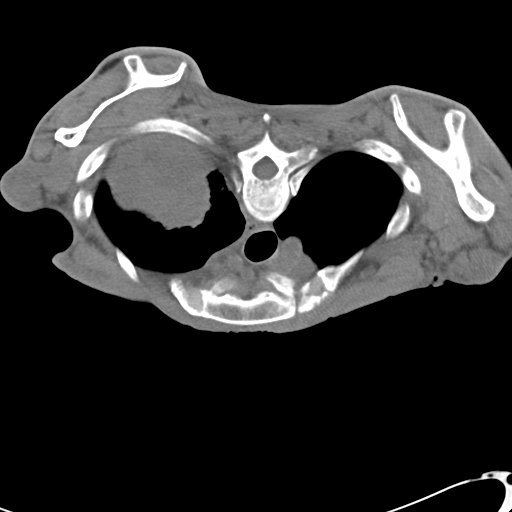
[im 19/30  soft-tissue]
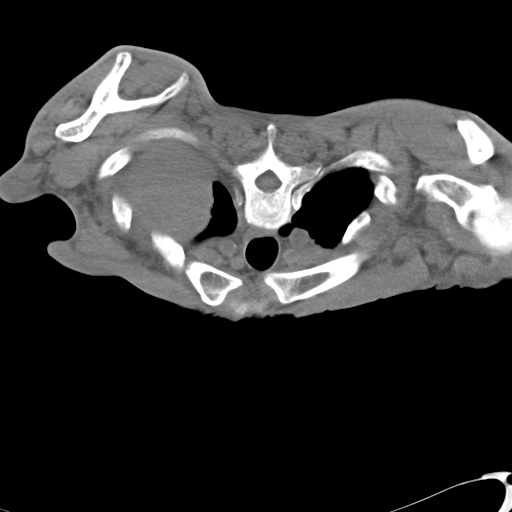
[im 22/30  soft-tissue]
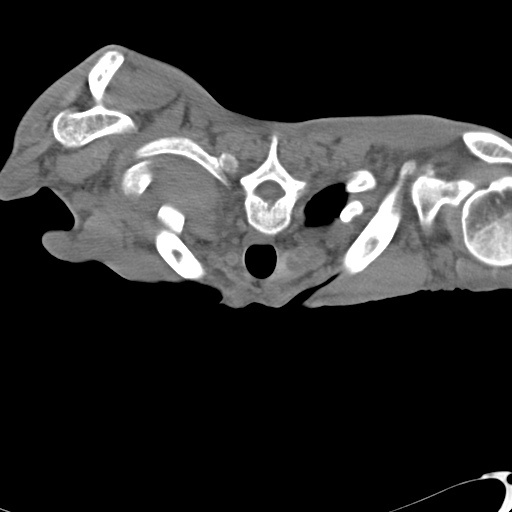
[im 26/30  soft-tissue]
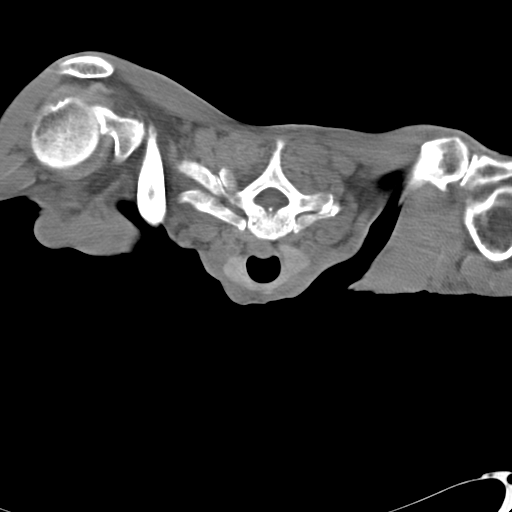

[Series 5: (hospital) 2.4 b30s · axial · 1.21mm/px · z∈[+1191,+1205]mm · 3 of 16 slices shown, 7 images]
[im 4/16  soft-tissue]
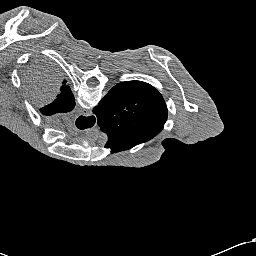
[im 4/16  lung]
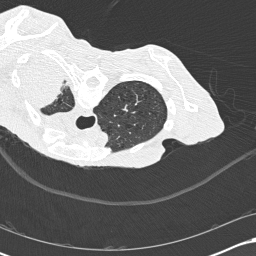
[im 4/16  bone]
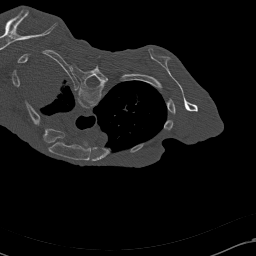
[im 8/16  soft-tissue]
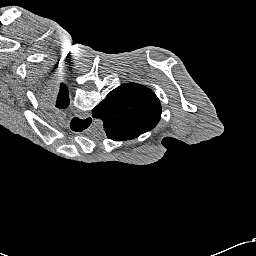
[im 8/16  lung]
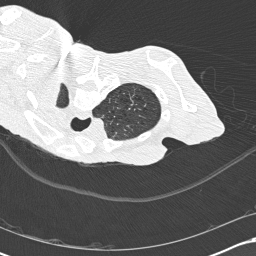
[im 12/16  soft-tissue]
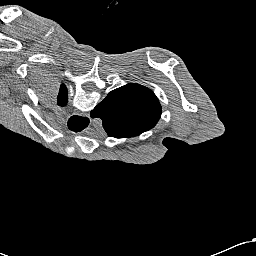
[im 12/16  lung]
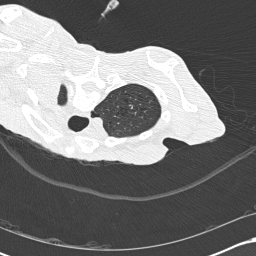

[Series 7: routine chest 2mm · axial · 0.63mm/px · z∈[+1169,+1245]mm · 8 of 46 slices shown]
[im 4/46  soft-tissue]
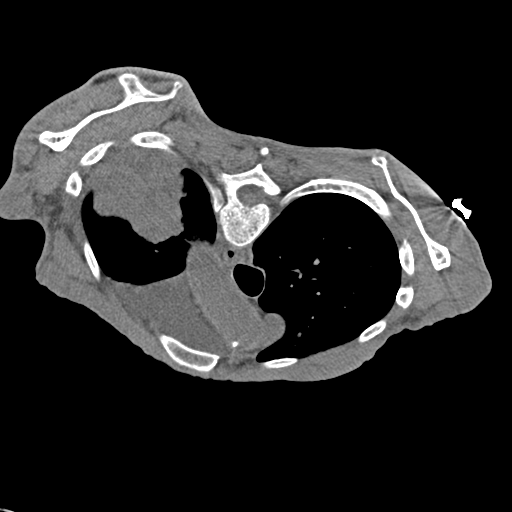
[im 10/46  soft-tissue]
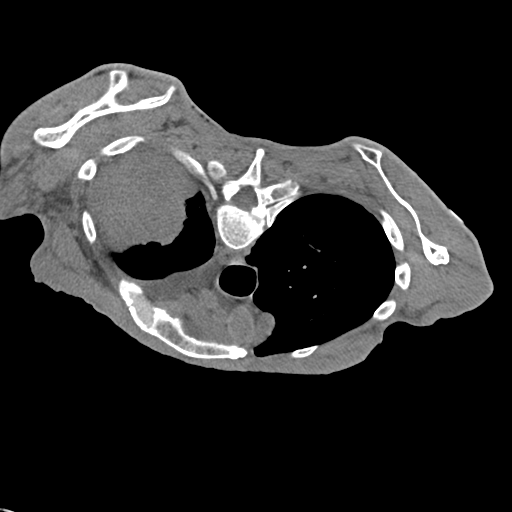
[im 17/46  soft-tissue]
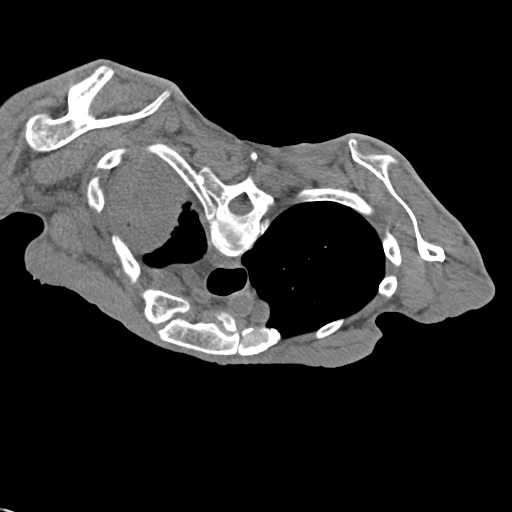
[im 20/46  soft-tissue]
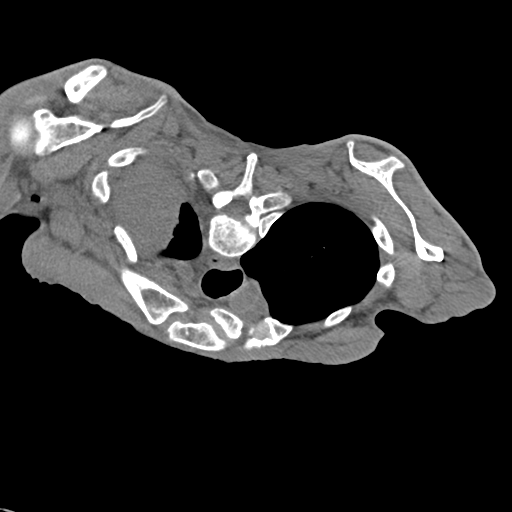
[im 26/46  soft-tissue]
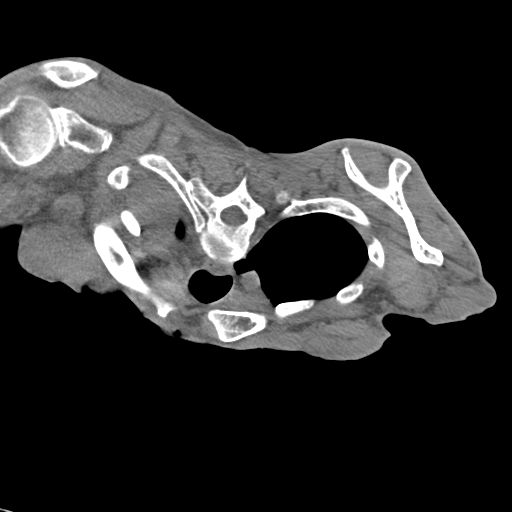
[im 29/46  soft-tissue]
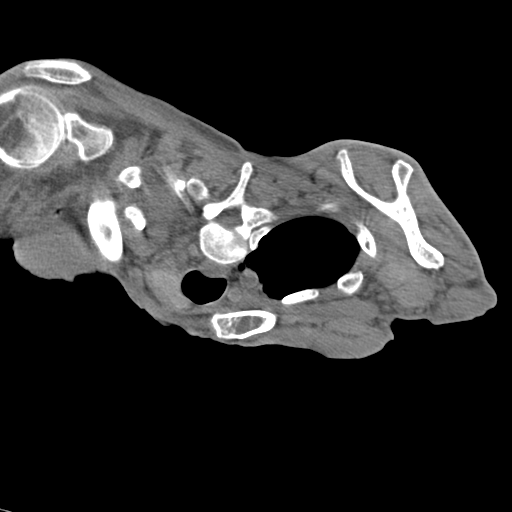
[im 36/46  soft-tissue]
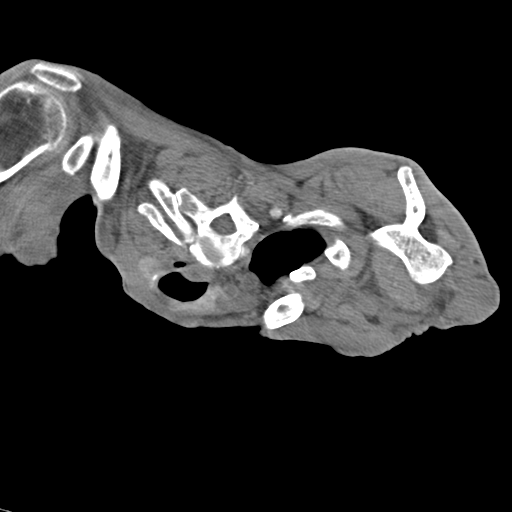
[im 42/46  soft-tissue]
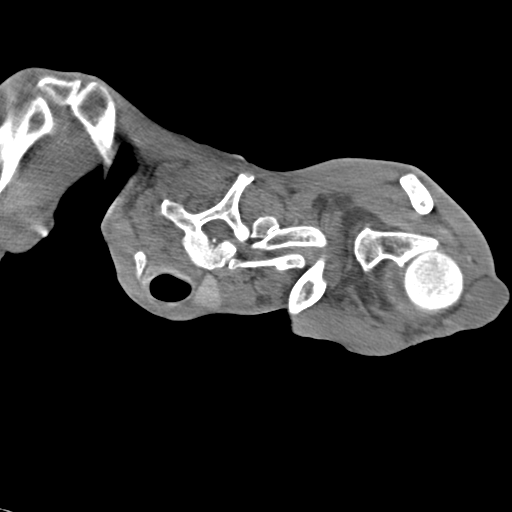

[18 of 32 positions shown; findings below may reference images not displayed]

EXAM:
CT GUIDED CORE BIOPSY OF LEFT UPPER LOBE LUNG MASS

ANESTHESIA/SEDATION:
Intravenous Fentanyl and Versed were administered as conscious
sedation during continuous cardiorespiratory monitoring by the
radiology RN, with a total moderate sedation time of 15 minutes.

PROCEDURE:
The procedure risks, benefits, and alternatives were explained to
the patient. Questions regarding the procedure were encouraged and
answered. The patient understands and consents to the procedure.

Patient placed prone. Select axial scans through the upper thorax
obtained. The lesion was localized and an appropriate skin entry
site identified. The site was prepped with chlorhexidinein a sterile
fashion, and a sterile drape was applied covering the operative
field. A sterile gown and sterile gloves were used for the
procedure. Local anesthesia was provided with 1% Lidocaine.

Under CT fluoroscopic guidance, a 17 gauge trocar needle was
advanced to the margin of the lesion. Once needle tip position was
confirmed, coaxial 18-gauge core biopsy samples were obtained,
submitted in formalin to surgical pathology. The guide needle was
removed. Postprocedure scans were obtained.

Complications: None immediate.
FINDINGS: Left upper lobe apical mass was again identified. CT-guided core
biopsy was performed without immediate complication. No post
procedure pneumothorax. Postprocedure observation is scheduled.
IMPRESSION: 1. Technically successful CT-guided core biopsy of left upper lobe
mass.
